# Patient Record
Sex: Male | Born: 1975 | Race: Black or African American | Hispanic: No | Marital: Single | Smoking: Current every day smoker
Health system: Southern US, Community
[De-identification: ages and names within clinical notes are randomized; demographics above are authoritative.]

---

## 2009-05-18 ENCOUNTER — Emergency Department (HOSPITAL_COMMUNITY): Admission: EM | Admit: 2009-05-18 | Discharge: 2009-05-18 | Payer: Self-pay | Admitting: Emergency Medicine

## 2009-07-01 ENCOUNTER — Emergency Department (HOSPITAL_COMMUNITY): Admission: EM | Admit: 2009-07-01 | Discharge: 2009-07-01 | Payer: Self-pay | Admitting: Emergency Medicine

## 2009-07-08 ENCOUNTER — Observation Stay (HOSPITAL_COMMUNITY): Admission: EM | Admit: 2009-07-08 | Discharge: 2009-07-08 | Payer: Self-pay | Admitting: Emergency Medicine

## 2009-12-22 ENCOUNTER — Emergency Department (HOSPITAL_COMMUNITY): Admission: EM | Admit: 2009-12-22 | Discharge: 2009-12-22 | Payer: Self-pay | Admitting: Emergency Medicine
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
## 2010-11-17 LAB — URINALYSIS, ROUTINE W REFLEX MICROSCOPIC
Glucose, UA: NEGATIVE mg/dL
Hgb urine dipstick: NEGATIVE
Leukocytes, UA: NEGATIVE
Protein, ur: 30 mg/dL — AB
Specific Gravity, Urine: 1.042 — ABNORMAL HIGH (ref 1.005–1.030)
Urobilinogen, UA: 0.2 mg/dL (ref 0.0–1.0)
pH: 6 (ref 5.0–8.0)

## 2010-11-17 LAB — CK TOTAL AND CKMB (NOT AT ARMC): CK, MB: 0.6 ng/mL (ref 0.3–4.0)

## 2010-11-17 LAB — POCT I-STAT, CHEM 8
Calcium, Ion: 1.23 mmol/L (ref 1.12–1.32)
Creatinine, Ser: 0.7 mg/dL (ref 0.4–1.5)
Glucose, Bld: 94 mg/dL (ref 70–99)
HCT: 49 % (ref 39.0–52.0)
Hemoglobin: 16.7 g/dL (ref 13.0–17.0)
Potassium: 3.5 mEq/L (ref 3.5–5.1)
Sodium: 140 mEq/L (ref 135–145)
TCO2: 27 mmol/L (ref 0–100)

## 2011-08-26 ENCOUNTER — Emergency Department (HOSPITAL_COMMUNITY)
Admission: EM | Admit: 2011-08-26 | Discharge: 2011-08-26 | Disposition: A | Discharge status: Home / Self Care | Payer: Self-pay | Attending: Emergency Medicine | Admitting: Emergency Medicine

## 2011-08-26 ENCOUNTER — Encounter (HOSPITAL_COMMUNITY): Payer: Self-pay

## 2011-08-26 DIAGNOSIS — H5789 Other specified disorders of eye and adnexa: Secondary | ICD-10-CM | POA: Insufficient documentation

## 2011-08-26 DIAGNOSIS — H579 Unspecified disorder of eye and adnexa: Secondary | ICD-10-CM | POA: Insufficient documentation

## 2011-08-26 DIAGNOSIS — H109 Unspecified conjunctivitis: Secondary | ICD-10-CM | POA: Insufficient documentation

## 2011-08-26 DIAGNOSIS — H5710 Ocular pain, unspecified eye: Secondary | ICD-10-CM | POA: Insufficient documentation

## 2011-08-26 DIAGNOSIS — H53149 Visual discomfort, unspecified: Secondary | ICD-10-CM | POA: Insufficient documentation

## 2011-08-26 MED ORDER — PREDNISONE 10 MG PO TABS: 20.0000 mg | ORAL_TABLET | Freq: Every day | ORAL | Status: AC

## 2011-08-26 MED ORDER — TOBRAMYCIN 0.3 % OP SOLN
2.0000 [drp] | OPHTHALMIC | Status: DC
  Administered 2011-08-26: 2 [drp] via OPHTHALMIC
  Filled 2011-08-26: qty 5

## 2011-08-26 NOTE — ED Notes (Signed)
Pt presents with bilateral red/itchy/ painful eyes x 2 days.

## 2011-08-26 NOTE — ED Provider Notes (Signed)
History     CSN: [id number]  Arrival date & time 08/26/11  1857   None     Chief Complaint  Patient presents with  . Conjunctivitis    (Consider location/radiation/quality/duration/timing/severity/associated sxs/prior treatment) Patient is a 36 y.o. male presenting with conjunctivitis. The history is provided by the patient.  Conjunctivitis  The current episode started 2 days ago. The onset was gradual. The problem occurs continuously. The problem has been gradually worsening. The problem is moderate. The symptoms are relieved by nothing. The symptoms are aggravated by light. Associated symptoms include eye itching, photophobia, eye pain and eye redness. Pertinent negatives include no fever, no decreased vision, no double vision, no abdominal pain, no neck pain, no cough, no wheezing and no eye discharge. The eye pain is moderate. There is pain in both eyes. The eye pain is not associated with movement. He has been behaving normally. He has been eating and drinking normally. There were sick contacts at work.    History reviewed. No pertinent past medical history.  History reviewed. No pertinent past surgical history.  History reviewed. No pertinent family history.  History  Substance Use Topics  . Smoking status: Current Everyday Smoker -- 0.5 packs/day  . Smokeless tobacco: Not on file  . Alcohol Use: No      Review of Systems  Constitutional: Negative for fever and activity change.       All ROS Neg except as noted in HPI  HENT: Negative for nosebleeds and neck pain.   Eyes: Positive for photophobia, pain, redness and itching. Negative for double vision and discharge.  Respiratory: Negative for cough, shortness of breath and wheezing.   Cardiovascular: Negative for chest pain and palpitations.  Gastrointestinal: Negative for abdominal pain and blood in stool.  Genitourinary: Negative for dysuria, frequency and hematuria.  Musculoskeletal: Negative for back pain and  arthralgias.  Skin: Negative.   Neurological: Negative for dizziness, seizures and speech difficulty.  Psychiatric/Behavioral: Negative for hallucinations and confusion.    Allergies  Review of patient's allergies indicates no known allergies.  Home Medications   Current Outpatient Rx  Name Route Sig Dispense Refill  . PREDNISONE 10 MG PO TABS Oral Take 2 tablets (20 mg total) by mouth daily. 10 tablet 0    BP 121/80  Pulse 80  Temp(Src) 98.3 F (36.8 C) (Oral)  Resp 20  Ht 5\' 11"  (1.803 m)  Wt 184 lb (83.462 kg)  BMI 25.66 kg/m2  SpO2 100%  Physical Exam  Nursing note and vitals reviewed. Constitutional: He is oriented to person, place, and time. He appears well-developed and well-nourished.  Non-toxic appearance.  HENT:  Head: Normocephalic.  Right Ear: Tympanic membrane and external ear normal.  Left Ear: Tympanic membrane and external ear normal.  Eyes: EOM and lids are normal. Pupils are equal, round, and reactive to light.       Increased redness of the conjunctiva bilaterally. Minimal swelling of the upper and lower lids. Mild tenderness under both eyes.  Neck: Normal range of motion. Neck supple. Carotid bruit is not present.  Cardiovascular: Normal rate, regular rhythm, normal heart sounds, intact distal pulses and normal pulses.   Pulmonary/Chest: Breath sounds normal. No respiratory distress.  Abdominal: Soft. Bowel sounds are normal. There is no tenderness. There is no guarding.  Musculoskeletal: Normal range of motion.  Lymphadenopathy:       Head (right side): No submandibular adenopathy present.       Head (left side): No submandibular adenopathy present.  He has no cervical adenopathy.  Neurological: He is alert and oriented to person, place, and time. He has normal strength. No cranial nerve deficit or sensory deficit.  Skin: Skin is warm and dry.  Psychiatric: He has a normal mood and affect. His speech is normal.    ED Course  Procedures  (including critical care time) Pulse oximetry 100% on room air. Within normal limits by my interpretation. Labs Reviewed - No data to display No results found.   1. Conjunctivitis       MDM  I have reviewed nursing notes, vital signs, and all appropriate lab and imaging results for this patient. Patient advised to use cool compresses to both eyes 3 times a day. Wash hands frequently. Prednisone daily with food. Tobramycin eyedrops every 4 hours. Patient to see an eye specialist if not improving.       Kathie Dike, [place] 08/26/11 2119

## 2011-08-27 NOTE — ED Provider Notes (Signed)
Medical screening examination/treatment/procedure(s) were performed by non-physician practitioner and as supervising physician I was immediately available for consultation/collaboration.   Calysta Craigo M Avriel Kandel, DO 08/27/11 0013 

## 2023-02-01 ENCOUNTER — Emergency Department (HOSPITAL_COMMUNITY)
Admission: EM | Admit: 2023-02-01 | Discharge: 2023-02-01 | Disposition: A | Discharge status: Home / Self Care | Payer: Self-pay | Attending: Emergency Medicine | Admitting: Emergency Medicine

## 2023-02-01 ENCOUNTER — Other Ambulatory Visit: Payer: Self-pay

## 2023-02-01 DIAGNOSIS — K029 Dental caries, unspecified: Secondary | ICD-10-CM | POA: Insufficient documentation

## 2023-02-01 DIAGNOSIS — K0889 Other specified disorders of teeth and supporting structures: Secondary | ICD-10-CM

## 2023-02-01 DIAGNOSIS — K0381 Cracked tooth: Secondary | ICD-10-CM | POA: Insufficient documentation

## 2023-02-01 MED ORDER — PENICILLIN V POTASSIUM 500 MG PO TABS: 500.0000 mg | ORAL_TABLET | Freq: Four times a day (QID) | ORAL | 0 refills | Status: AC
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
## 2023-02-01 NOTE — Discharge Instructions (Signed)
You were seen in the emergency department for dental pain.  As we discussed I think your pain is likely related to a cavity. We normally treat this with anti-inflammatories medication, and antibiotics.   I've attached a resource guide with several dentists in the area. It's incredibly important you follow up with a dentist as soon as possible for definitive treatment.   Continue to monitor how you're doing and return to the ER for new or worsening symptoms such as difficulty swallowing your own saliva, difficulty breathing, or fever.
